# Patient Record
Sex: Female | Born: 1960 | Race: White | Hispanic: No | Marital: Married | Smoking: Never smoker
Health system: Southern US, Community
[De-identification: ages and names within clinical notes are randomized; demographics above are authoritative.]

## PROBLEM LIST (undated history)

## (undated) HISTORY — PX: CHOLECYSTECTOMY: SHX55

---

## 2009-02-02 ENCOUNTER — Encounter: Admission: RE | Admit: 2009-02-02 | Discharge: 2009-02-02 | Payer: Self-pay

## 2010-02-02 ENCOUNTER — Encounter
Admission: RE | Admit: 2010-02-02 | Discharge: 2010-02-02 | Payer: Self-pay | Source: Home / Self Care | Attending: Family Medicine | Admitting: Family Medicine

## 2010-12-27 ENCOUNTER — Other Ambulatory Visit: Payer: Self-pay | Admitting: Obstetrics and Gynecology

## 2010-12-27 DIAGNOSIS — Z1231 Encounter for screening mammogram for malignant neoplasm of breast: Secondary | ICD-10-CM

## 2011-02-08 ENCOUNTER — Other Ambulatory Visit: Payer: Self-pay | Admitting: Obstetrics and Gynecology

## 2011-02-08 ENCOUNTER — Ambulatory Visit
Admission: RE | Admit: 2011-02-08 | Discharge: 2011-02-08 | Disposition: A | Discharge status: Home / Self Care | Payer: Self-pay | Source: Ambulatory Visit | Attending: Obstetrics and Gynecology | Admitting: Obstetrics and Gynecology

## 2011-02-08 DIAGNOSIS — Z1231 Encounter for screening mammogram for malignant neoplasm of breast: Secondary | ICD-10-CM

## 2011-10-25 ENCOUNTER — Encounter: Payer: Self-pay | Admitting: *Deleted

## 2011-10-25 ENCOUNTER — Emergency Department
Admission: EM | Admit: 2011-10-25 | Discharge: 2011-10-25 | Disposition: A | Discharge status: Home / Self Care | Payer: Commercial Managed Care - PPO | Source: Home / Self Care

## 2011-10-25 DIAGNOSIS — R300 Dysuria: Secondary | ICD-10-CM

## 2011-10-25 LAB — POCT URINALYSIS DIP (MANUAL ENTRY)
Nitrite, UA: POSITIVE
pH, UA: 5 (ref 5–8)

## 2011-10-25 MED ORDER — CEFTRIAXONE SODIUM 1 G IJ SOLR
1.0000 g | Freq: Once | INTRAMUSCULAR | Status: AC
  Administered 2011-10-25: 1 g via INTRAMUSCULAR

## 2011-10-25 MED ORDER — CEPHALEXIN 500 MG PO CAPS: 500.0000 mg | ORAL_CAPSULE | Freq: Three times a day (TID) | ORAL | Status: AC

## 2011-10-25 NOTE — ED Provider Notes (Signed)
History     CSN: [id number]  Arrival date & time 10/25/11  1110   First MD Initiated Contact with Patient 10/25/11 1111      Chief Complaint  Patient presents with  . Dysuria   HPI DYSURIA Onset:  1 week  Description: dysuria, increased urinary frequency, mild flank pain  Modifying factors: has been taking azo for last 2-3 days; mildly improved sxs, now returned   Symptoms Urgency:  yes Frequency: yes  Hesitancy:  yes Hematuria:  no Flank Pain:  mild Fever: no Nausea/Vomiting:  no Missed LMP: no STD exposure: no Discharge: no Irritants: no Rash: no  Red Flags   More than 3 UTI's last 12 months:  no PMH of  Diabetes or Immunosuppression:  no Renal Disease/Calculi: no Urinary Tract Abnormality:  no Instrumentation or Trauma: no    History reviewed. No pertinent past medical history.  Past Surgical History  Procedure Date  . Cholecystectomy     Family History  Problem Relation Age of Onset  . Diabetes Mother     History  Substance Use Topics  . Smoking status: Never Smoker   . Smokeless tobacco: Not on file  . Alcohol Use: Yes    OB History    Grav Para Term Preterm Abortions TAB SAB Ect Mult Living                  Review of Systems  All other systems reviewed and are negative.    Allergies  Review of patient's allergies indicates no known allergies.  Home Medications   Current Outpatient Rx  Name Route Sig Dispense Refill  . MONTELUKAST SODIUM 10 MG PO TABS Oral Take 10 mg by mouth at bedtime.      BP 115/77  Pulse 88  Temp 98.1 F (36.7 C) (Oral)  Resp 14  Ht 5\' 1"  (1.549 m)  Wt 137 lb (62.143 kg)  BMI 25.89 kg/m2  SpO2 95%  Physical Exam  Constitutional: She is oriented to person, place, and time. She appears well-developed and well-nourished.  HENT:  Head: Normocephalic and atraumatic.  Eyes: Conjunctivae normal are normal. Pupils are equal, round, and reactive to light.  Neck: Normal range of motion. Neck supple.    Cardiovascular: Normal rate and regular rhythm.   Pulmonary/Chest: Effort normal and breath sounds normal.  Abdominal: Soft. Bowel sounds are normal.       + suprapubic tenderness    Musculoskeletal: Normal range of motion.  Neurological: She is alert and oriented to person, place, and time.  Skin: Skin is warm.    ED Course  Procedures (including critical care time)   Labs Reviewed  POCT URINALYSIS DIP (MANUAL ENTRY)  URINE CULTURE   No results found.   1. Dysuria       MDM  Will clinically treat for UTI. Rocephin 1gm IM x1 give 1 week of sxs.  No clinical signs of ascending infection currently. Afebrile.  Keflex x 7 days.  Urine culture.  Noted urine glucose of 250. CBG in 90s-reassuring. May have been 2/2 AZO. Diabetic red flags discussed.  Discussed infectious and general red flags.  Follow up as needed.       The patient and/or caregiver has been counseled thoroughly with regard to treatment plan and/or medications prescribed including dosage, schedule, interactions, rationale for use, and possible side effects and they verbalize understanding. Diagnoses and expected course of recovery discussed and will return if not improved as expected or if the condition worsens. Patient  and/or caregiver verbalized understanding.             Doree Albee, MD 10/25/11 1235

## 2011-10-25 NOTE — ED Notes (Signed)
Patient c/o urinary frequency and dysuria x 1 week. Pelvic pain x 1 1/2 days. Taken AZO for 2 days.

## 2011-10-27 ENCOUNTER — Telehealth: Payer: Self-pay | Admitting: *Deleted

## 2011-10-28 LAB — URINE CULTURE

## 2012-02-14 ENCOUNTER — Other Ambulatory Visit: Payer: Self-pay | Admitting: Obstetrics and Gynecology

## 2012-02-14 ENCOUNTER — Ambulatory Visit (INDEPENDENT_AMBULATORY_CARE_PROVIDER_SITE_OTHER): Payer: Commercial Managed Care - PPO

## 2012-02-14 DIAGNOSIS — Z1231 Encounter for screening mammogram for malignant neoplasm of breast: Secondary | ICD-10-CM

## 2013-02-14 ENCOUNTER — Ambulatory Visit (INDEPENDENT_AMBULATORY_CARE_PROVIDER_SITE_OTHER): Payer: Commercial Managed Care - PPO

## 2013-02-14 DIAGNOSIS — Z1231 Encounter for screening mammogram for malignant neoplasm of breast: Secondary | ICD-10-CM

## 2014-01-13 ENCOUNTER — Other Ambulatory Visit: Payer: Self-pay | Admitting: Obstetrics and Gynecology

## 2014-01-13 DIAGNOSIS — Z1231 Encounter for screening mammogram for malignant neoplasm of breast: Secondary | ICD-10-CM

## 2014-02-20 ENCOUNTER — Ambulatory Visit (INDEPENDENT_AMBULATORY_CARE_PROVIDER_SITE_OTHER): Payer: Commercial Managed Care - PPO

## 2014-02-20 DIAGNOSIS — Z1231 Encounter for screening mammogram for malignant neoplasm of breast: Secondary | ICD-10-CM

## 2014-12-13 ENCOUNTER — Emergency Department (INDEPENDENT_AMBULATORY_CARE_PROVIDER_SITE_OTHER): Payer: Commercial Managed Care - PPO

## 2014-12-13 ENCOUNTER — Emergency Department
Admission: EM | Admit: 2014-12-13 | Discharge: 2014-12-13 | Disposition: A | Discharge status: Home / Self Care | Payer: Commercial Managed Care - PPO | Source: Home / Self Care | Attending: Family Medicine | Admitting: Family Medicine

## 2014-12-13 ENCOUNTER — Encounter: Payer: Self-pay | Admitting: Emergency Medicine

## 2014-12-13 DIAGNOSIS — X58XXXA Exposure to other specified factors, initial encounter: Secondary | ICD-10-CM | POA: Diagnosis not present

## 2014-12-13 DIAGNOSIS — S62305A Unspecified fracture of fourth metacarpal bone, left hand, initial encounter for closed fracture: Secondary | ICD-10-CM

## 2014-12-13 DIAGNOSIS — S62303A Unspecified fracture of third metacarpal bone, left hand, initial encounter for closed fracture: Secondary | ICD-10-CM

## 2014-12-13 DIAGNOSIS — S62309A Unspecified fracture of unspecified metacarpal bone, initial encounter for closed fracture: Secondary | ICD-10-CM

## 2014-12-13 MED ORDER — HYDROCODONE-ACETAMINOPHEN 10-325 MG PO TABS: ORAL_TABLET | ORAL | Status: DC

## 2014-12-13 NOTE — Consult Note (Signed)
   Subjective:    I'm seeing this patient as a consultation for:  Junius FinnerErin O'Malley PA-C  CC: Left hand fracture  HPI: Earlier today this pleasant 54 year old female was putting up decorations, fell onto her left hand and had immediate pain, swelling, bruising, and mild deformity. She presented to urgent care where x-rays showed a couple of fractures to her metacarpals and I was called for further evaluation and definitive treatment. Her pain is mild to moderate, and she has no numbness or tingling.  Past medical history, Surgical history, Family history not pertinant except as noted below, Social history, Allergies, and medications have been entered into the medical record, reviewed, and no changes needed.   Review of Systems: No headache, visual changes, nausea, vomiting, diarrhea, constipation, dizziness, abdominal pain, skin rash, fevers, chills, night sweats, weight loss, swollen lymph nodes, body aches, joint swelling, muscle aches, chest pain, shortness of breath, mood changes, visual or auditory hallucinations.   Objective:   General: Well Developed, well nourished, and in no acute distress.  Neuro/Psych: Alert and oriented x3, extra-ocular muscles intact, able to move all 4 extremities, sensation grossly intact. Skin: Warm and dry, no rashes noted.  Respiratory: Not using accessory muscles, speaking in full sentences, trachea midline.  Cardiovascular: Pulses palpable, no extremity edema. Abdomen: Does not appear distended. Left hand: Visibly swollen, slightly bruised, tender to palpation over the third and fourth metacarpal shafts with palpable crepitus. Neurovascular intact distally.  X-rays reviewed and shows spiral fractures that are mildly displaced but not angulated through the third and fourth metacarpal shafts.  A large ulnar gutter splint was placed.  Impression and Recommendations:   This case required medical decision making of moderate complexity.   1. Third and fourth  metacarpal shaft fractures: Ulnar gutter splint, hydrocodone, I will discuss her x-rays with hand surgery but she should plan to see me back middle of next week for additional x-rays.  ___________________________________________ Ihor Austinhomas J. Benjamin Stainhekkekandam, M.D., ABFM., CAQSM. Primary Care and Sports Medicine [facility]  Adjunct Instructor of Family Medicine  [employer]

## 2014-12-13 NOTE — ED Provider Notes (Signed)
CSN: [id number]     Arrival date & time 12/13/14  1159 History   First MD Initiated Contact with Patient 12/13/14 1206     Chief Complaint  Patient presents with  . Hand Pain   (Consider location/radiation/quality/duration/timing/severity/associated sxs/prior Treatment) HPI  Pt is a 54yo female presenting to [facility] with c/o sudden onset Left hand pain after she lost her footing and fell off a step-stool about 2 hours PTA. Pt states she landed on her Left sided with majority of her weight landing on her Left hand.  Pain is worse on the dorsal lateral aspect of her hand. Pain is aching and throbbing, worse with movement and palpation, 4/10 at this time. No pain medication taken PTA. Pt has been using her husband's old sling and an ice pain with minimal relief.  Pt states she was unable to move her hand immediately after the incident but has gradually had increased ROM but states she still cannot grasp anything. Pt notes she also landed on her Left hip but states it feels like a bruise. Denies hitting her head. No other injuries. Pt is Right hand dominant.   History reviewed. No pertinent past medical history. Past Surgical History  Procedure Laterality Date  . Cholecystectomy     Family History  Problem Relation Age of Onset  . Diabetes Mother    Social History  Substance Use Topics  . Smoking status: Never Smoker   . Smokeless tobacco: None  . Alcohol Use: Yes   OB History    No data available     Review of Systems  Musculoskeletal: Positive for myalgias, joint swelling and arthralgias.       Left hand and wrist  Skin: Negative for color change and wound.  Neurological: Positive for weakness ( Left hand). Negative for numbness.    Allergies  Review of patient's allergies indicates no known allergies.  Home Medications   Prior to Admission medications   Medication Sig Start Date End Date Taking? Authorizing Provider  HYDROcodone-acetaminophen (NORCO) 10-325 MG tablet 1/2-1  tablet by mouth every 8 hours as needed for pain 12/13/14   Monica Becton, MD  montelukast (SINGULAIR) 10 MG tablet Take 10 mg by mouth at bedtime.    Historical Provider, MD   Meds Ordered and Administered this Visit  Medications - No data to display  BP 133/88 mmHg  Pulse 72  Temp(Src) 98.4 F (36.9 C) (Oral)  Ht  (1.549 m)  Wt 136 lb 12 oz (62.029 kg)  BMI 25.85 kg/m2  SpO2 98% No data found.   Physical Exam  Constitutional: She is oriented to person, place, and time. She appears well-developed and well-nourished.  HENT:  Head: Normocephalic and atraumatic.  Eyes: EOM are normal.  Neck: Normal range of motion.  Cardiovascular: Normal rate.   Pulses:      Radial pulses are 2+ on the left side.  Left hand: cap refill <3 seconds  Pulmonary/Chest: Effort normal.  Musculoskeletal: She exhibits edema and tenderness.  Left hand: mild edema to dorsal aspect. Tenderness over 3rd-5th metacarpals. Full ROM at the wrist but increased pain. 3/5 grip strength. Unable to oppose thumb to 5th finger due to pain. No tenderness to fingers  Left elbow and shoulder: full ROM, non-tender  Neurological: She is alert and oriented to person, place, and time.  Left hand: normal sensation compared to Right hand  Skin: Skin is warm and dry.  Left hand and wrist: skin in tact. No ecchymosis or erythema  Psychiatric: She has a normal mood and affect. Her behavior is normal.  Nursing note and vitals reviewed.   ED Course  Procedures (including critical care time)  Labs Review Labs Reviewed - No data to display  Imaging Review Dg Wrist Complete Left  12/13/2014  CLINICAL DATA:  Patient fell off a step ladder today injuring her left hand and wrist. EXAM: LEFT WRIST - COMPLETE 3+ VIEW COMPARISON:  None FINDINGS: There are oblique fractures of the third and fourth metacarpals, minimally displaced, non comminuted non angulated. No wrist fracture. Wrist joints are normally spaced and  aligned. The soft tissues are unremarkable. IMPRESSION: 1. No wrist fracture or dislocation. 2. Fractures of the third and fourth metacarpals. Electronically Signed   By: Amie Portlandavid  Ormond M.D.   On: 12/13/2014 12:58   Dg Hand Complete Left  12/13/2014  CLINICAL DATA:  Larey SeatFell off ladder.  Injury to left hand and left wrist EXAM: LEFT HAND - COMPLETE 3+ VIEW COMPARISON:  12/13/2014 FINDINGS: There are acute, obliquely oriented fractures involving the base of the third and fourth metacarpal bones. Mild volar angulation of the distal fracture fragments. No dislocations identified. No radio-opaque foreign bodies or soft tissue calcifications. IMPRESSION: 1. Acute fractures are noted involving the third and fourth metacarpal bones. Electronically Signed   By: Signa Kellaylor  Stroud M.D.   On: 12/13/2014 12:57      MDM   1. Fracture of fourth metacarpal bone of left hand, closed, initial encounter   2. Fracture of third metacarpal bone of left hand, closed, initial encounter     Pt c/o Left hand hand pain after falling off a stepstool 2 hours PTA.  Mild left hip pain but no other injuries. Pt has decreased strength and ROM in Left hand. Tenderness worst over 3-5th metacarpals. Sensation and circulation in tact. Skin in tact.  Plain films: fracture of third and fourth metacarpal bones. Mild volar angulation.  Consulted with Dr. Benjamin Stainhekkekandam, Sports Medicine, who also reviewed imaging and examined pt.  Pt placed in an ulnar-gutter splint.  See his consult note.    F/u with Dr. Benjamin Stainhekkekandam next week for recheck of symptoms. Pt was prescribed vicodin for severe pain. May take acetaminophen and ibuprofen otherwise. Encouraged ice and elevation. Patient verbalized understanding and agreement with treatment plan.     Junius Finnerrin O'Malley, PA-C 12/13/14 1428

## 2014-12-13 NOTE — ED Notes (Signed)
Patient states that she fell at home this morning striking her left hand on the floor. Slight edema noted and increased pain with movement. Rates pain 4/10

## 2014-12-16 ENCOUNTER — Ambulatory Visit (INDEPENDENT_AMBULATORY_CARE_PROVIDER_SITE_OTHER): Payer: Commercial Managed Care - PPO | Admitting: Sports Medicine

## 2014-12-16 ENCOUNTER — Encounter: Payer: Self-pay | Admitting: Sports Medicine

## 2014-12-16 ENCOUNTER — Ambulatory Visit (INDEPENDENT_AMBULATORY_CARE_PROVIDER_SITE_OTHER): Payer: Commercial Managed Care - PPO

## 2014-12-16 VITALS — BP 120/76 | HR 77 | Wt 135.0 lb

## 2014-12-16 DIAGNOSIS — S62309D Unspecified fracture of unspecified metacarpal bone, subsequent encounter for fracture with routine healing: Secondary | ICD-10-CM

## 2014-12-16 DIAGNOSIS — X58XXXD Exposure to other specified factors, subsequent encounter: Secondary | ICD-10-CM | POA: Diagnosis not present

## 2014-12-16 DIAGNOSIS — S62309A Unspecified fracture of unspecified metacarpal bone, initial encounter for closed fracture: Secondary | ICD-10-CM | POA: Insufficient documentation

## 2014-12-16 DIAGNOSIS — S62303D Unspecified fracture of third metacarpal bone, left hand, subsequent encounter for fracture with routine healing: Secondary | ICD-10-CM

## 2014-12-16 DIAGNOSIS — S62305D Unspecified fracture of fourth metacarpal bone, left hand, subsequent encounter for fracture with routine healing: Secondary | ICD-10-CM | POA: Diagnosis not present

## 2014-12-16 NOTE — Assessment & Plan Note (Signed)
Stable on x-ray, continue ulnar gutter splint. Pain control is adequate. Return to see me in 2 weeks, x-ray before visit.  I billed a fracture code for this encounter, all subsequent visits will be post-op checks in the global period.

## 2014-12-16 NOTE — Progress Notes (Signed)
  Subjective:    CC: recheck fracture  HPI: This is a pleasant 54 year old female, I saw her this weekend for a hand fracture involving the third and fourth metacarpal shaft, we placed her in an ulnar gutter splint, I did discuss this with orthopedic hand surgery, and they recommended continued conservative care. She is doing well, she is having some swelling, and feels as though her splint is a bit tight. Otherwise she's only had to use a single hydrocodone, it has only been using Tylenol.  Past medical history, Surgical history, Family history not pertinant except as noted below, Social history, Allergies, and medications have been entered into the medical record, reviewed, and no changes needed.   Review of Systems: No fevers, chills, night sweats, weight loss, chest pain, or shortness of breath.   Objective:    General: Well Developed, well nourished, and in no acute distress.  Neuro: Alert and oriented x3, extra-ocular muscles intact, sensation grossly intact.  HEENT: Normocephalic, atraumatic, pupils equal round reactive to light, neck supple, no masses, no lymphadenopathy, thyroid nonpalpable.  Skin: Warm and dry, no rashes. Cardiac: Regular rate and rhythm, no murmurs rubs or gallops, no lower extremity edema.  Respiratory: Clear to auscultation bilaterally. Not using accessory muscles, speaking in full sentences. Left hand: Splint is removed, minimally tender over the fracture site, bruising is present with a few fracture blisters. She is neurovascularly intact distally.  I removed the splint, and then reapplied it any looser fashion.  X-rays reviewed, fracture alignment is stable, there continues to be mild displacement without angulation or evidence of rotatory deformity.   Impression and Recommendations:

## 2014-12-17 ENCOUNTER — Ambulatory Visit: Payer: Commercial Managed Care - PPO | Admitting: Sports Medicine

## 2014-12-22 ENCOUNTER — Telehealth: Payer: Self-pay | Admitting: Sports Medicine

## 2014-12-22 NOTE — Telephone Encounter (Signed)
Pt advised,verbalized understanding. 

## 2014-12-22 NOTE — Telephone Encounter (Signed)
Pt contacted clinic to see if she was able to drive for long distances with her fracture. Pt states this is part of her job, and has a long trip coming up soon. Will route to Provider for review.

## 2014-12-22 NOTE — Telephone Encounter (Signed)
Not a problem, just take a baby aspirin for trips longer than 2 hours.

## 2014-12-29 ENCOUNTER — Telehealth: Payer: Self-pay | Admitting: Emergency Medicine

## 2014-12-29 ENCOUNTER — Emergency Department (INDEPENDENT_AMBULATORY_CARE_PROVIDER_SITE_OTHER)
Admission: EM | Admit: 2014-12-29 | Discharge: 2014-12-29 | Disposition: A | Discharge status: Home / Self Care | Payer: Commercial Managed Care - PPO | Source: Home / Self Care | Attending: Family Medicine | Admitting: Family Medicine

## 2014-12-29 ENCOUNTER — Encounter: Payer: Self-pay | Admitting: Sports Medicine

## 2014-12-29 ENCOUNTER — Ambulatory Visit (INDEPENDENT_AMBULATORY_CARE_PROVIDER_SITE_OTHER): Payer: Commercial Managed Care - PPO | Admitting: Sports Medicine

## 2014-12-29 ENCOUNTER — Ambulatory Visit (INDEPENDENT_AMBULATORY_CARE_PROVIDER_SITE_OTHER): Payer: Commercial Managed Care - PPO

## 2014-12-29 DIAGNOSIS — S62309D Unspecified fracture of unspecified metacarpal bone, subsequent encounter for fracture with routine healing: Secondary | ICD-10-CM

## 2014-12-29 DIAGNOSIS — S62305D Unspecified fracture of fourth metacarpal bone, left hand, subsequent encounter for fracture with routine healing: Secondary | ICD-10-CM | POA: Diagnosis not present

## 2014-12-29 DIAGNOSIS — S62303D Unspecified fracture of third metacarpal bone, left hand, subsequent encounter for fracture with routine healing: Secondary | ICD-10-CM | POA: Diagnosis not present

## 2014-12-29 DIAGNOSIS — X58XXXD Exposure to other specified factors, subsequent encounter: Secondary | ICD-10-CM

## 2014-12-29 DIAGNOSIS — Z09 Encounter for follow-up examination after completed treatment for conditions other than malignant neoplasm: Secondary | ICD-10-CM

## 2014-12-29 NOTE — Progress Notes (Signed)
  Subjective:  1 week post fracture of the third and fourth metacarpal shafts, doing well. Ulnar gutter splint.  Objective: General: Well-developed, well-nourished, and in no acute distress. Left hand: Splint is removed: Tender to palpation over the fracture, no sign of bacterial superinfection, fracture blisters have popped. Neurovascularly intact distally. There may be a small degree of rotational deformity of the middle finger.  X-rays reviewed and shows stability of the fracture, minimally displaced, minimally angulated.  Assessment/plan:

## 2014-12-29 NOTE — ED Notes (Signed)
Laurie Weaver reports that her splint that was reapplied today at her visit with Dr T feels tighter than it previously did and is causing pain.

## 2014-12-29 NOTE — Assessment & Plan Note (Signed)
Recheck fracture, x-ray before visit

## 2014-12-29 NOTE — ED Provider Notes (Signed)
Pt is a 54yo female [facility] with c/o Left hand swelling and discomfort after she had a splint readjusted this afternoon with Dr. Benjamin Stainhekkekandam.  Pt was being seen by Dr. Benjamin Stainhekkekandam for a follow up visit 2 weeks after Left hand fracture on 11/19.  [facility]    Ace wrap was removed, Vaseline applied to a dry area of skin on Left wrist. Splint readjusted slightly, then ace wrap reapplied. Pt stated the splint feels much better after loosing of ace wrap.   Pt encouraged to f/u as instructed by Dr. Benjamin Stainhekkekandam. Patient verbalized understanding and agreement with treatment plan.      Junius FinnerErin O'Malley, PA-C 12/29/14 1757

## 2015-01-12 ENCOUNTER — Ambulatory Visit (INDEPENDENT_AMBULATORY_CARE_PROVIDER_SITE_OTHER): Payer: Commercial Managed Care - PPO | Admitting: Sports Medicine

## 2015-01-12 ENCOUNTER — Other Ambulatory Visit: Payer: Self-pay | Admitting: Obstetrics and Gynecology

## 2015-01-12 ENCOUNTER — Ambulatory Visit (INDEPENDENT_AMBULATORY_CARE_PROVIDER_SITE_OTHER): Payer: Commercial Managed Care - PPO

## 2015-01-12 ENCOUNTER — Encounter: Payer: Self-pay | Admitting: Sports Medicine

## 2015-01-12 VITALS — BP 128/75 | HR 73 | Temp 98.0°F | Resp 16 | Wt 133.7 lb

## 2015-01-12 DIAGNOSIS — S62309D Unspecified fracture of unspecified metacarpal bone, subsequent encounter for fracture with routine healing: Secondary | ICD-10-CM

## 2015-01-12 DIAGNOSIS — Z1231 Encounter for screening mammogram for malignant neoplasm of breast: Secondary | ICD-10-CM

## 2015-01-12 DIAGNOSIS — X58XXXD Exposure to other specified factors, subsequent encounter: Secondary | ICD-10-CM | POA: Diagnosis not present

## 2015-01-12 DIAGNOSIS — S62303D Unspecified fracture of third metacarpal bone, left hand, subsequent encounter for fracture with routine healing: Secondary | ICD-10-CM

## 2015-01-12 DIAGNOSIS — S62305D Unspecified fracture of fourth metacarpal bone, left hand, subsequent encounter for fracture with routine healing: Secondary | ICD-10-CM | POA: Diagnosis not present

## 2015-01-12 NOTE — Progress Notes (Signed)
  Subjective:  3 weeks post closed fractures, spiral of the third and fourth metacarpals, doing well.   Objective: General: Well-developed, well-nourished, and in no acute distress. Left hand: There is still a bit of pain over the fractures, motion is still limited by pain. There is mild bruising and swelling, fracture blisters have healed.  X-rays reviewed, there is good alignment without displacement or angulation, good bony callus formation.  Assessment/plan:

## 2015-01-12 NOTE — Assessment & Plan Note (Addendum)
Continue splint, wrapped again, return in 3 weeks. No x-rays needed. At the next visit we will probably get her into physical therapy to regain range of motion.

## 2015-02-02 ENCOUNTER — Encounter: Payer: Self-pay | Admitting: Sports Medicine

## 2015-02-02 ENCOUNTER — Ambulatory Visit (INDEPENDENT_AMBULATORY_CARE_PROVIDER_SITE_OTHER): Payer: Commercial Managed Care - PPO | Admitting: Sports Medicine

## 2015-02-02 ENCOUNTER — Ambulatory Visit: Payer: Commercial Managed Care - PPO | Admitting: Sports Medicine

## 2015-02-02 ENCOUNTER — Ambulatory Visit (INDEPENDENT_AMBULATORY_CARE_PROVIDER_SITE_OTHER): Payer: Commercial Managed Care - PPO

## 2015-02-02 DIAGNOSIS — S62324D Displaced fracture of shaft of fourth metacarpal bone, right hand, subsequent encounter for fracture with routine healing: Secondary | ICD-10-CM | POA: Diagnosis not present

## 2015-02-02 DIAGNOSIS — S62323D Displaced fracture of shaft of third metacarpal bone, left hand, subsequent encounter for fracture with routine healing: Secondary | ICD-10-CM | POA: Diagnosis not present

## 2015-02-02 DIAGNOSIS — X58XXXD Exposure to other specified factors, subsequent encounter: Secondary | ICD-10-CM

## 2015-02-02 DIAGNOSIS — S62309D Unspecified fracture of unspecified metacarpal bone, subsequent encounter for fracture with routine healing: Secondary | ICD-10-CM

## 2015-02-02 NOTE — Assessment & Plan Note (Signed)
7 weeks post fractures, repeat x-ray. There is some stiffness as expected, discontinue splint, and start formal physical therapy.

## 2015-02-02 NOTE — Progress Notes (Signed)
  Subjective: Several weeks post nondisplaced fractures of the bases of the third and fourth metacarpals, doing well   Objective: General: Well-developed, well-nourished, and in no acute distress. Left hand: Splint is removed, no tenderness over the fractures, still with significant limitations in range of motion and stiffness in the interphalangeal joints due to immobility.  Assessment/plan:

## 2015-02-02 NOTE — Addendum Note (Signed)
Addended by: Monica BectonHEKKEKANDAM, THOMAS J on: 02/02/2015 02:11 PM   Modules accepted: Orders

## 2015-02-27 ENCOUNTER — Ambulatory Visit (INDEPENDENT_AMBULATORY_CARE_PROVIDER_SITE_OTHER): Payer: Commercial Managed Care - PPO

## 2015-02-27 DIAGNOSIS — Z1231 Encounter for screening mammogram for malignant neoplasm of breast: Secondary | ICD-10-CM

## 2015-03-02 ENCOUNTER — Ambulatory Visit (INDEPENDENT_AMBULATORY_CARE_PROVIDER_SITE_OTHER): Payer: Commercial Managed Care - PPO | Admitting: Sports Medicine

## 2015-03-02 ENCOUNTER — Encounter: Payer: Self-pay | Admitting: Sports Medicine

## 2015-03-02 VITALS — BP 128/77 | HR 85 | Resp 16 | Wt 136.3 lb

## 2015-03-02 DIAGNOSIS — S62309D Unspecified fracture of unspecified metacarpal bone, subsequent encounter for fracture with routine healing: Secondary | ICD-10-CM

## 2015-03-02 NOTE — Assessment & Plan Note (Signed)
Doing well post fractures, continue with home rehabilitation. She is approximately 75% better, she can return to see me as needed if hand range of motion doesn't return to normal in a four-month period

## 2015-03-02 NOTE — Progress Notes (Signed)
  Subjective: 11 weeks post fractures of the third and fourth metacarpal shafts, doing well with physical therapy, approximately 75% back to normal. Minimal pain.  Objective: General: Well-developed, well-nourished, and in no acute distress. Left hand: Still has some fullness over the metacarpals likely in association with the bony callus. Is almost able to attain full flexion at the MCPs.  Assessment/plan:

## 2015-09-27 ENCOUNTER — Encounter: Payer: Self-pay | Admitting: Emergency Medicine

## 2015-09-27 ENCOUNTER — Emergency Department
Admission: EM | Admit: 2015-09-27 | Discharge: 2015-09-27 | Disposition: A | Discharge status: Home / Self Care | Payer: Commercial Managed Care - PPO | Source: Home / Self Care | Attending: Family Medicine | Admitting: Family Medicine

## 2015-09-27 DIAGNOSIS — S61210A Laceration without foreign body of right index finger without damage to nail, initial encounter: Secondary | ICD-10-CM

## 2015-09-27 DIAGNOSIS — Z23 Encounter for immunization: Secondary | ICD-10-CM | POA: Diagnosis not present

## 2015-09-27 MED ORDER — TETANUS-DIPHTH-ACELL PERTUSSIS 5-2.5-18.5 LF-MCG/0.5 IM SUSP
0.5000 mL | Freq: Once | INTRAMUSCULAR | Status: AC
  Administered 2015-09-27: 0.5 mL via INTRAMUSCULAR

## 2015-09-27 MED ORDER — IBUPROFEN 600 MG PO TABS: 600.0000 mg | ORAL_TABLET | Freq: Once | ORAL | Status: DC

## 2015-09-27 NOTE — Discharge Instructions (Signed)
Change dressing daily and apply Bacitracin ointment to wound.  Keep wound clean and dry.  Return for any signs of infection (or follow-up with family doctor):  Increasing redness, swelling, pain, heat, drainage, etc. Return in 10 days for suture removal (or follow-up with family doctor).  May take Tylenol or Ibuprofen as needed for pain.

## 2015-09-27 NOTE — ED Provider Notes (Signed)
[facility]    CSN: [id number] Arrival date & time: 09/27/15  1728  First Provider Contact:  First MD Initiated Contact with Patient 09/27/15 1829        History   Chief Complaint Chief Complaint  Patient presents with  . Finger Injury    HPI Laurie Weaver is a 55 y.o. female.   Patient lacerated her right index finger one hour ago on a food processor blade.  She is unsure of her last Tdap.   The history is provided by the patient and the spouse.  Laceration  Location:  Finger Finger laceration location:  R index finger Length:  1cm Depth:  Through dermis Quality: straight   Bleeding: controlled   Time since incident:  1 hour Laceration mechanism:  Metal edge Pain details:    Quality:  Aching   Severity:  Mild   Timing:  Constant   Progression:  Unchanged Foreign body present:  No foreign bodies Relieved by:  Pressure Worsened by:  Movement Ineffective treatments:  None tried Tetanus status:  Out of date Associated symptoms: no swelling     History reviewed. No pertinent past medical history.  Patient Active Problem List   Diagnosis Date Noted  . Closed fracture of third and fourth metacarpals of left hand 12/16/2014    Past Surgical History:  Procedure Laterality Date  . CHOLECYSTECTOMY      OB History    No data available       Home Medications    Prior to Admission medications   Medication Sig Start Date End Date Taking? Authorizing Provider  acetaminophen (TYLENOL) 325 MG tablet Take 650 mg by mouth every 6 (six) hours as needed.    Historical Provider, MD  montelukast (SINGULAIR) 10 MG tablet Take 10 mg by mouth at bedtime.    Historical Provider, MD    Family History Family History  Problem Relation Age of Onset  . Diabetes Mother     Social History Social History  Substance Use Topics  . Smoking status: Never Smoker  . Smokeless tobacco: Never Used  . Alcohol use Yes     Allergies   Review of patient's allergies  indicates no known allergies.   Review of Systems Review of Systems  All other systems reviewed and are negative.    Physical Exam Triage Vital Signs ED Triage Vitals  Enc Vitals Group     BP 09/27/15 1751 121/71     Pulse Rate 09/27/15 1751 71     Resp 09/27/15 1751 16     Temp 09/27/15 1751 97.9 F (36.6 C)     Temp Source 09/27/15 1751 Oral     SpO2 09/27/15 1751 97 %     Weight 09/27/15 1752 135 lb (61.2 kg)     Height 09/27/15 1752 5\' 1"  (1.549 m)     Head Circumference --      Peak Flow --      Pain Score 09/27/15 1753 2     Pain Loc --      Pain Edu? --      Excl. in GC? --    No data found.   Updated Vital Signs BP 121/71 (BP Location: Left Arm)   Pulse 71   Temp 97.9 F (36.6 C) (Oral)   Resp 16   Ht 5\' 1"  (1.549 m)   Wt 135 lb (61.2 kg)   SpO2 97%   BMI 25.51 kg/m   Visual Acuity Right Eye Distance:  Left Eye Distance:   Bilateral Distance:    Right Eye Near:   Left Eye Near:    Bilateral Near:     Physical Exam  Constitutional: She appears well-developed and well-nourished. No distress.  HENT:  Head: Atraumatic.  Eyes: Pupils are equal, round, and reactive to light.  Cardiovascular: Normal rate.   Pulmonary/Chest: Effort normal.  Musculoskeletal:       Right hand: She exhibits tenderness and laceration. She exhibits normal range of motion, no bony tenderness, normal two-point discrimination, normal capillary refill, no deformity and no swelling.       Hands: Volar surface of DIP joint of the right second finger has a 1cm long simple superficial laceration as noted on diagram.  Distal phalanx has full range of motion.  Distal neurovascular function is intact.   Neurological: She is alert.  Skin: Skin is dry.  Nursing note and vitals reviewed.    UC Treatments / Results  Labs (all labs ordered are listed, but only abnormal results are displayed) Labs Reviewed - No data to display  EKG  EKG Interpretation None        Radiology No results found.  Procedures Procedures Laceration Repair Discussed benefits and risks of procedure and verbal consent obtained. Using sterile technique and digital anesthesia with 2% lidocaine without epinephrine, cleansed wound with Betadine followed by copious lavage with normal saline.  Wound carefully inspected for debris and foreign bodies; none found.  Wound closed with #3, 5-0 interrupted nylon sutures.  Bacitracin and non-stick sterile dressing applied.  Wound precautions explained to patient.  Return for suture removal in 10 days.   Medications Ordered in UC Medications  ibuprofen (ADVIL,MOTRIN) tablet 600 mg (not administered)  Tdap (BOOSTRIX) injection 0.5 mL (0.5 mLs Intramuscular Given 09/27/15 1802)     Initial Impression / Assessment and Plan / UC Course  I have reviewed the triage vital signs and the nursing notes.  Pertinent labs & imaging results that were available during my care of the patient were reviewed by me and considered in my medical decision making (see chart for details).  Clinical Course  Change dressing daily and apply Bacitracin ointment to wound.  Keep wound clean and dry.  Return for any signs of infection (or follow-up with family doctor):  Increasing redness, swelling, pain, heat, drainage, etc. Return in 10 days for suture removal (or follow-up with family doctor).  May take Tylenol or Ibuprofen as needed for pain.     Final Clinical Impressions(s) / UC Diagnoses   Final diagnoses:  Laceration of second finger of right hand, initial encounter    New Prescriptions New Prescriptions   No medications on file     Lattie HawStephen A Rahkim Rabalais, MD 10/06/15 1958

## 2015-09-27 NOTE — ED Triage Notes (Signed)
Patient just lacerated left index finger on food processor blade. Unaware of last tetanus immunization.

## 2015-10-09 ENCOUNTER — Emergency Department
Admission: EM | Admit: 2015-10-09 | Discharge: 2015-10-09 | Disposition: A | Discharge status: Home / Self Care | Payer: Commercial Managed Care - PPO | Source: Home / Self Care | Attending: Emergency Medicine | Admitting: Emergency Medicine

## 2015-10-09 DIAGNOSIS — Z5189 Encounter for other specified aftercare: Secondary | ICD-10-CM

## 2015-10-09 DIAGNOSIS — Z4802 Encounter for removal of sutures: Secondary | ICD-10-CM

## 2015-10-09 NOTE — ED Triage Notes (Signed)
Pt is here for suture removal from right index finger. No s/s of infection, tenderness.

## 2015-10-09 NOTE — ED Provider Notes (Signed)
[facility]    CSN: [id number] Arrival date & time: 10/09/15  1627  First Provider Contact:  None       History   Chief Complaint Chief Complaint  Patient presents with  . Suture / Staple Removal    HPI Laurie Weaver is a 55 y.o. female.   HPI Here for wound recheck and suture removal, 12 days status post repair of laceration right index finger. She denies any signs or symptoms of infection. Only minimally sore, but no pain . No drainage or bleeding. No numbness. She has been doing daily wound care. Function of the finger normal No past medical history on file.  Patient Active Problem List   Diagnosis Date Noted  . Closed fracture of third and fourth metacarpals of left hand 12/16/2014    Past Surgical History:  Procedure Laterality Date  . CHOLECYSTECTOMY      OB History    No data available       Home Medications    Prior to Admission medications   Medication Sig Start Date End Date Taking? Authorizing Provider  acetaminophen (TYLENOL) 325 MG tablet Take 650 mg by mouth every 6 (six) hours as needed.    Historical Provider, MD  montelukast (SINGULAIR) 10 MG tablet Take 10 mg by mouth at bedtime.    Historical Provider, MD    Family History Family History  Problem Relation Age of Onset  . Diabetes Mother     Social History Social History  Substance Use Topics  . Smoking status: Never Smoker  . Smokeless tobacco: Never Used  . Alcohol use Yes     Allergies   Review of patient's allergies indicates no known allergies.   Review of Systems Review of Systems  All other systems reviewed and are negative.    Physical Exam Triage Vital Signs ED Triage Vitals  Enc Vitals Group     BP 10/09/15 1647 118/64     Pulse Rate 10/09/15 1647 80     Resp --      Temp 10/09/15 1647 98.3 F (36.8 C)     Temp Source 10/09/15 1647 Oral     SpO2 10/09/15 1647 98 %     Weight --      Height --      Head Circumference --      Peak Flow --    Pain Score 10/09/15 1648 0     Pain Loc --      Pain Edu? --      Excl. in GC? --    No data found.   Updated Vital Signs BP 118/64 (BP Location: Left Arm)   Pulse 80   Temp 98.3 F (36.8 C) (Oral)   SpO2 98%   Visual Acuity Right Eye Distance:   Left Eye Distance:   Bilateral Distance:    Right Eye Near:   Left Eye Near:    Bilateral Near:     Physical Exam  Constitutional: She appears well-developed and well-nourished. No distress.  Musculoskeletal:  Right index finger wound is well approximated and well-healed. Clean and dry. No redness or tenderness or swelling. Tendons and function intact. Neurovascular distally intact.  Nursing note and vitals reviewed.    UC Treatments / Results  Labs (all labs ordered are listed, but only abnormal results are displayed) Labs Reviewed - No data to display  EKG  EKG Interpretation None       Radiology No results found.  Procedures Procedures (including critical care  time)  Medications Ordered in UC Medications - No data to display   Initial Impression / Assessment and Plan / UC Course  I have reviewed the triage vital signs and the nursing notes.  Pertinent labs & imaging results that were available during my care of the patient were reviewed by me and considered in my medical decision making (see chart for details).  Clinical Course    Today, I removed the 3 sutures without problem. Rechecked function and tendons and neurovascular exam was intact. Bacitracin and Band-Aid applied. Continue wound care and history guidance discussed. Recheck prn.  Final Clinical Impressions(s) / UC Diagnoses   Final diagnoses:  Visit for wound check  Encounter for removal of sutures    New Prescriptions Discharge Medication List as of 10/09/2015  5:07 PM       Lajean Manesavid Massey, MD 10/09/15 1710

## 2016-01-20 ENCOUNTER — Other Ambulatory Visit: Payer: Self-pay | Admitting: Obstetrics and Gynecology

## 2016-01-20 DIAGNOSIS — Z1239 Encounter for other screening for malignant neoplasm of breast: Secondary | ICD-10-CM

## 2016-03-04 ENCOUNTER — Ambulatory Visit (INDEPENDENT_AMBULATORY_CARE_PROVIDER_SITE_OTHER): Payer: Commercial Managed Care - PPO

## 2016-03-04 DIAGNOSIS — Z1231 Encounter for screening mammogram for malignant neoplasm of breast: Secondary | ICD-10-CM | POA: Diagnosis not present

## 2016-03-04 DIAGNOSIS — Z1239 Encounter for other screening for malignant neoplasm of breast: Secondary | ICD-10-CM

## 2016-08-17 IMAGING — CR DG HAND COMPLETE 3+V*L*
3 series · 3 of 3 positions shown · non-contrast
Comparison: 12/29/2014

CLINICAL DATA: Fall 3 weeks ago, recheck fracture, subsequent
encounter

EXAM:
LEFT HAND - COMPLETE 3+ VIEW

[hand pa]
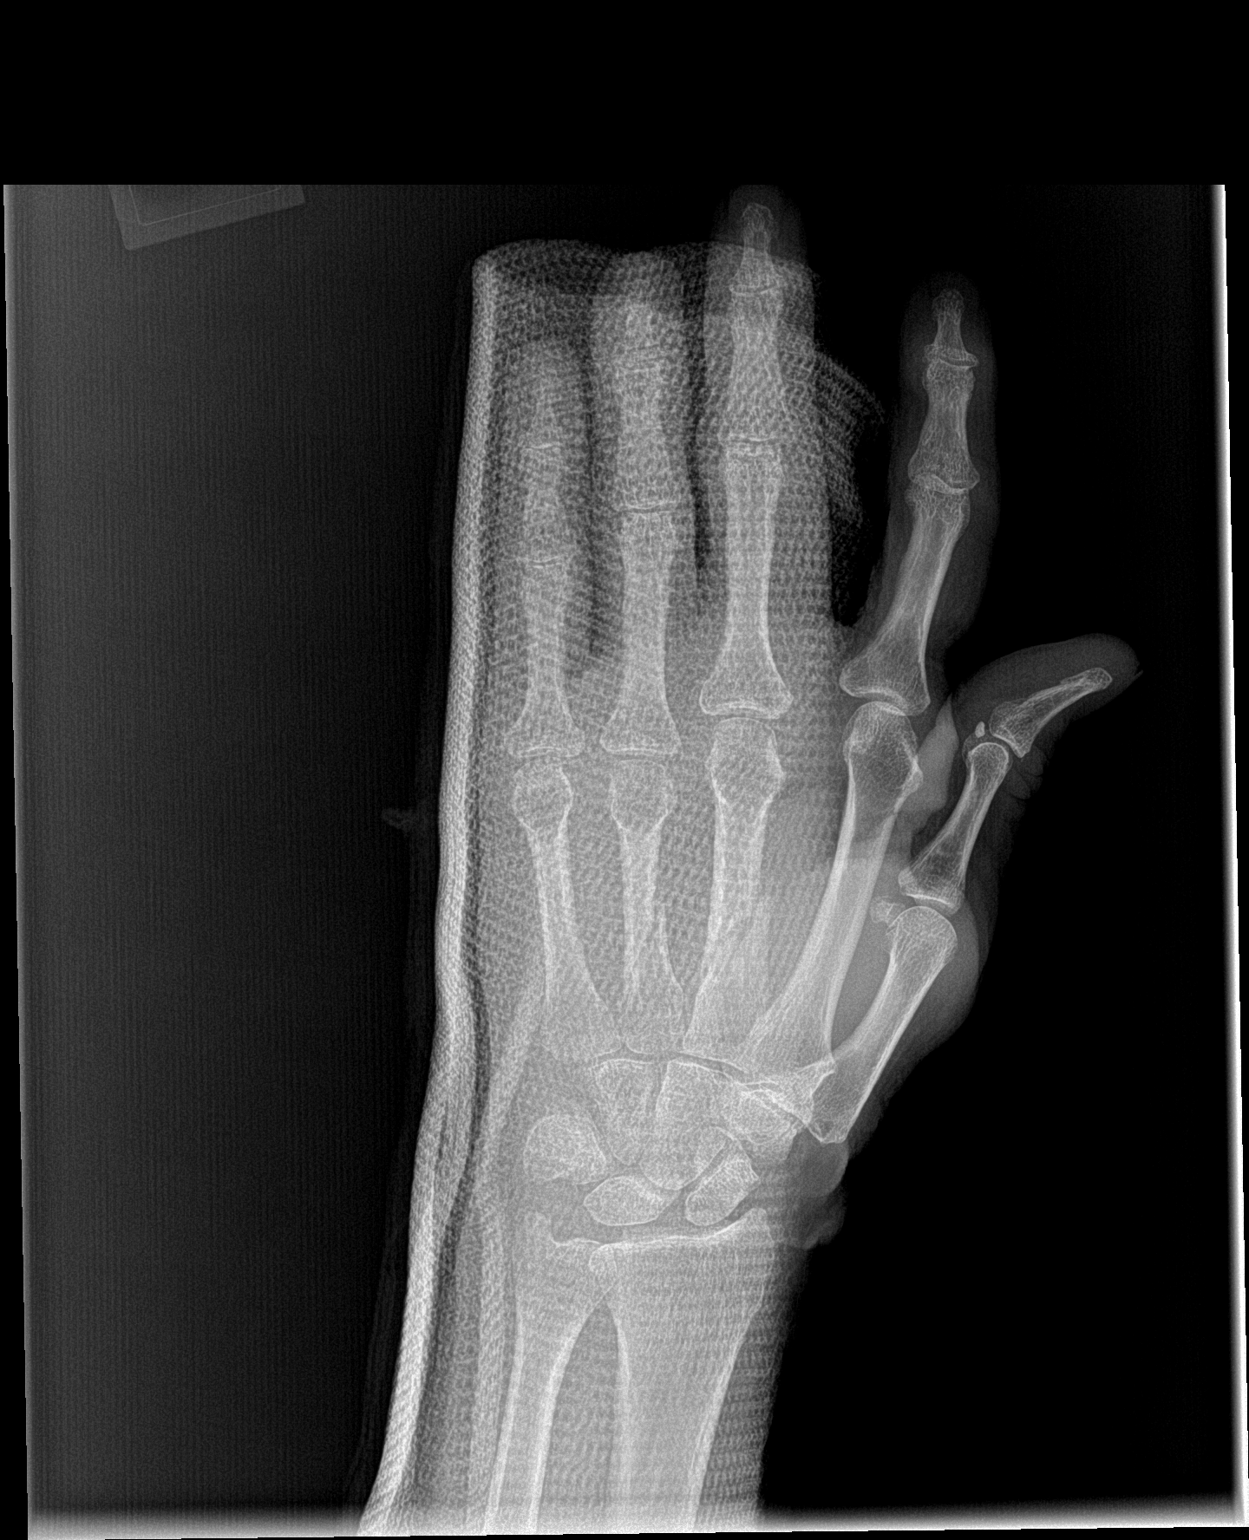

[hand obl]
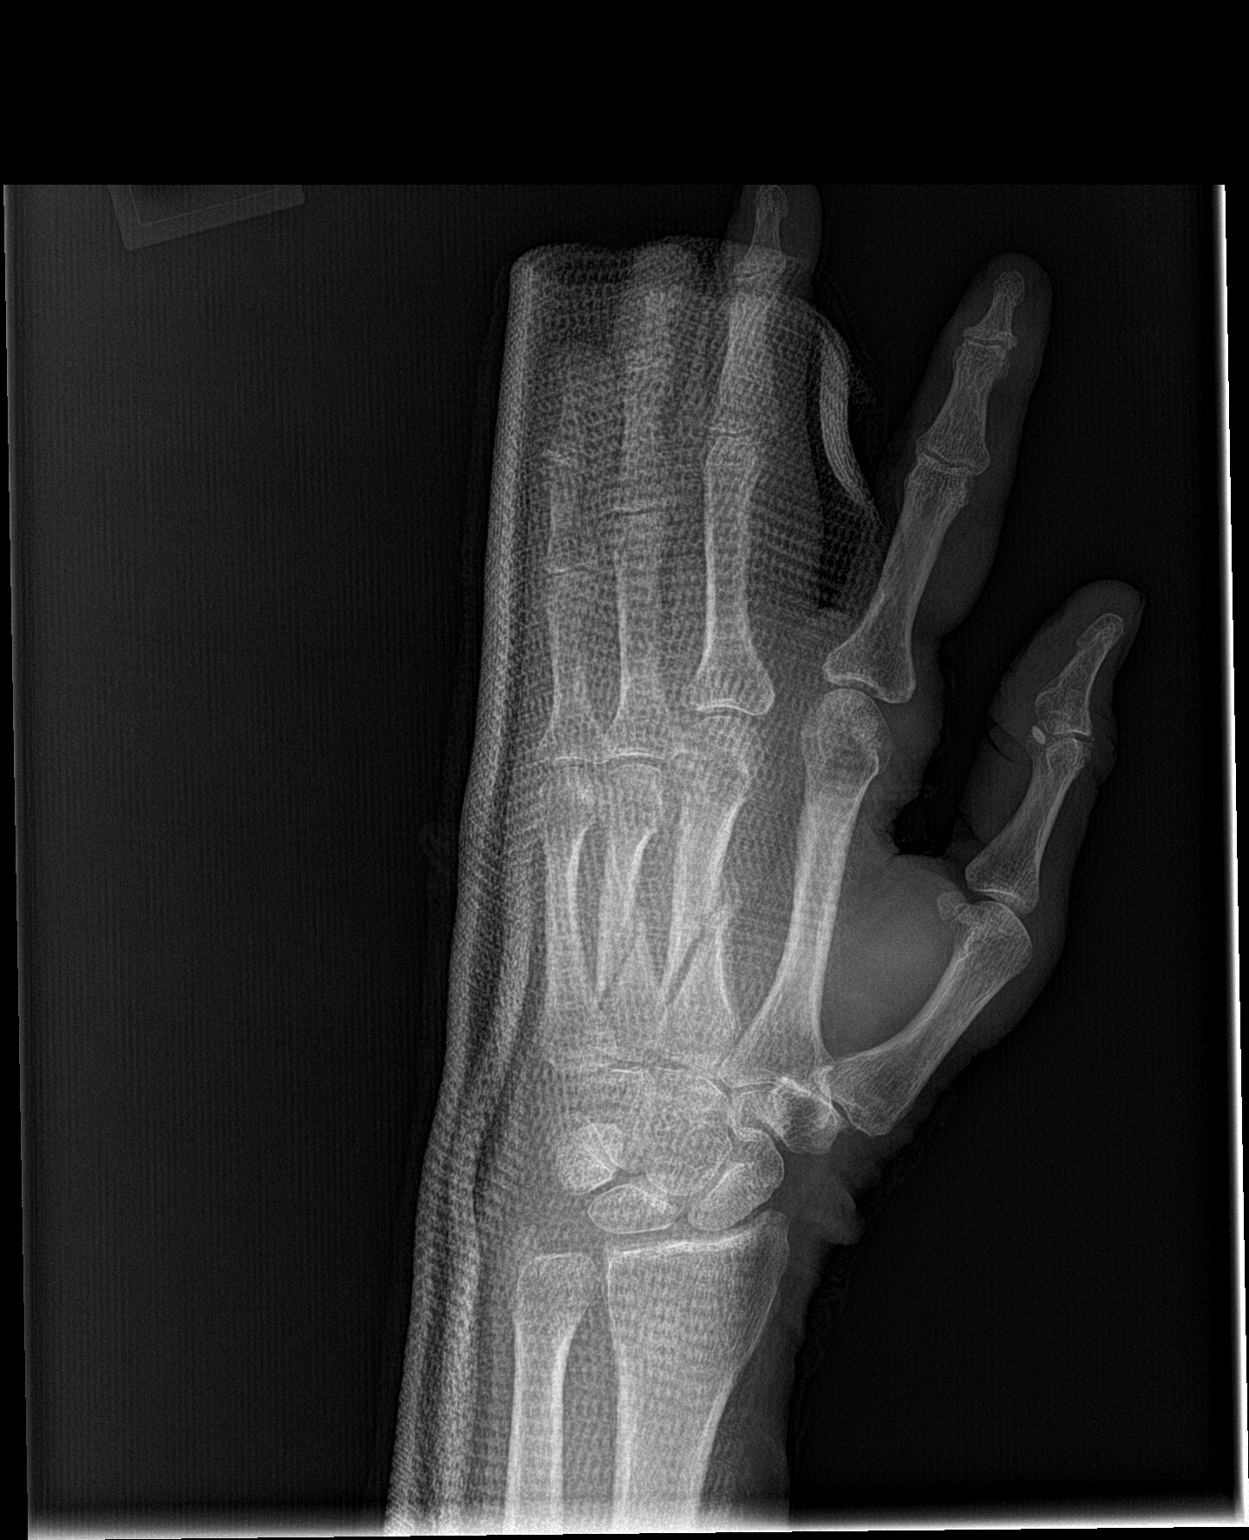

[hand lat]
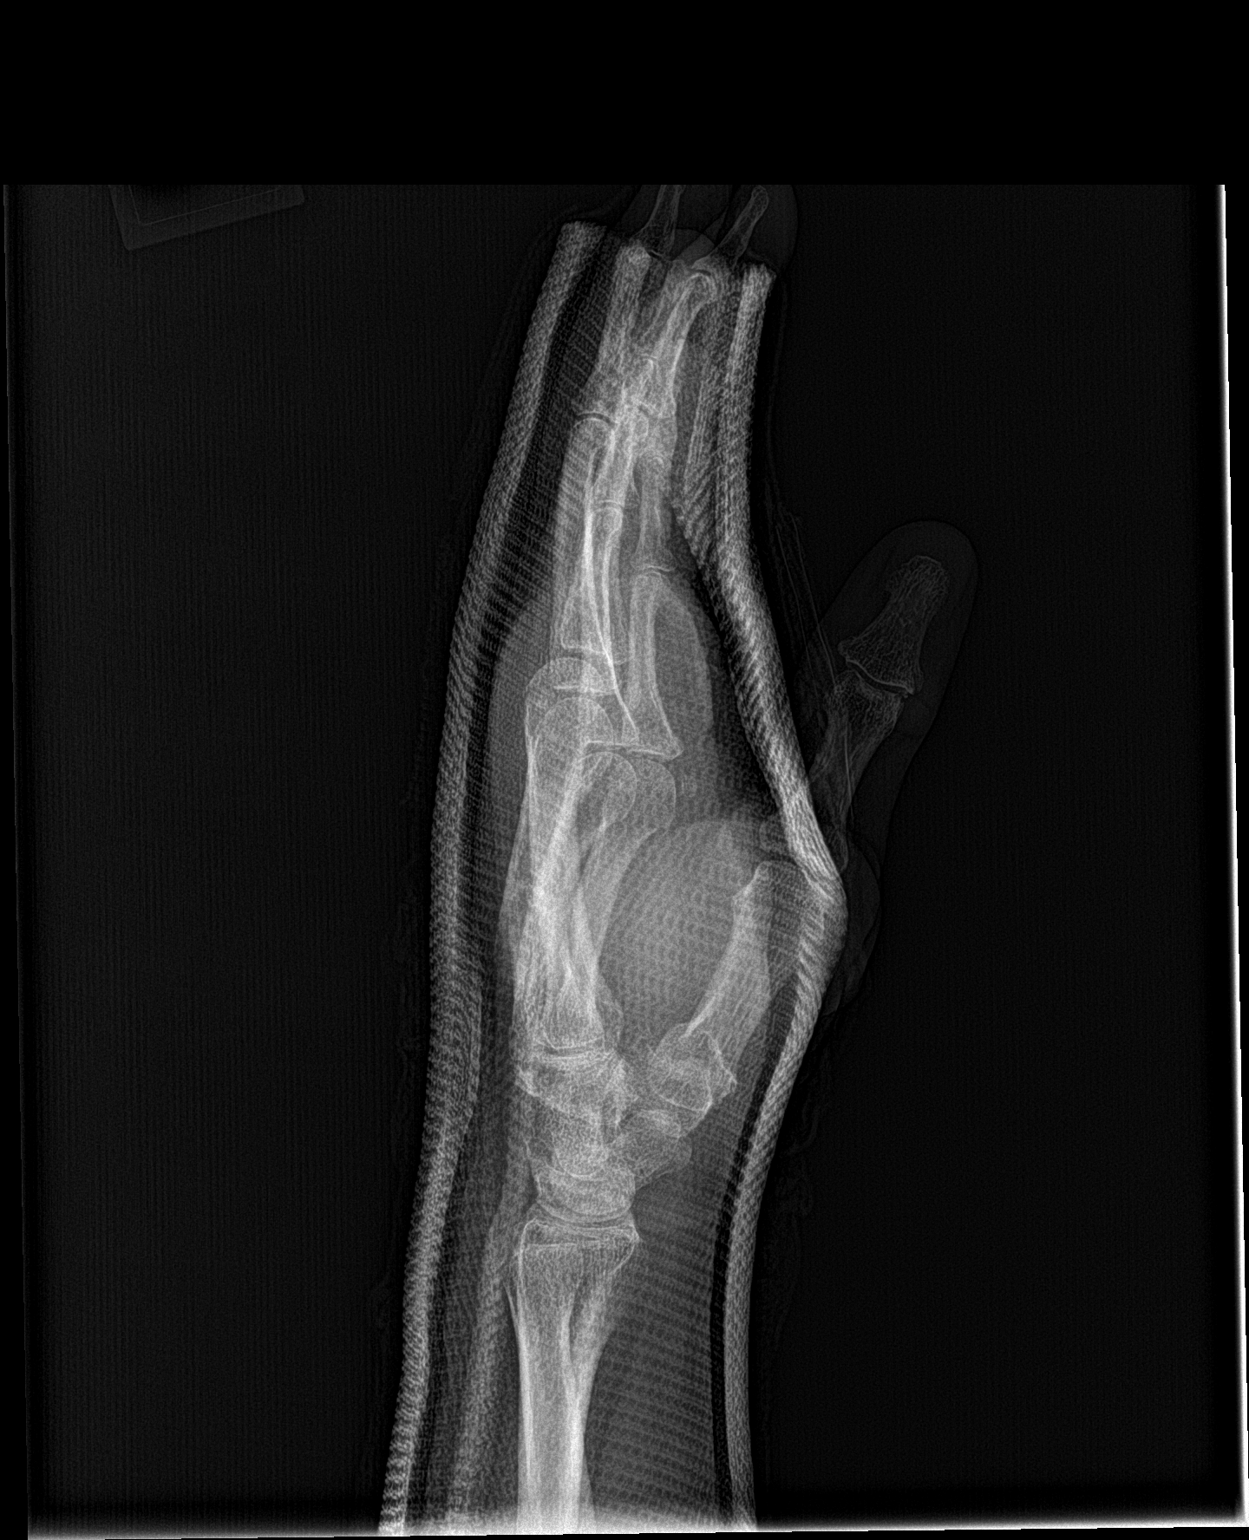

[3 of 3 positions shown; findings below may reference images not displayed]

FINDINGS: Three views of the right hand submitted. Study is limited by
splinting material artifact.

Again noted minimal displaced oblique fracture of third and fourth
metacarpals with healing response/callus formation. The fracture
line is still visible.
IMPRESSION: Again noted minimal displaced oblique fracture third and fourth
metacarpals with healing response/ callus formation noted.

## 2017-02-06 ENCOUNTER — Other Ambulatory Visit: Payer: Self-pay | Admitting: Obstetrics and Gynecology

## 2017-02-06 DIAGNOSIS — Z1231 Encounter for screening mammogram for malignant neoplasm of breast: Secondary | ICD-10-CM

## 2017-03-10 ENCOUNTER — Ambulatory Visit (INDEPENDENT_AMBULATORY_CARE_PROVIDER_SITE_OTHER): Payer: BLUE CROSS/BLUE SHIELD

## 2017-03-10 ENCOUNTER — Other Ambulatory Visit: Payer: Self-pay | Admitting: Obstetrics and Gynecology

## 2017-03-10 DIAGNOSIS — Z1231 Encounter for screening mammogram for malignant neoplasm of breast: Secondary | ICD-10-CM

## 2018-02-19 ENCOUNTER — Other Ambulatory Visit: Payer: Self-pay | Admitting: Obstetrics and Gynecology

## 2018-02-19 DIAGNOSIS — Z0000 Encounter for general adult medical examination without abnormal findings: Secondary | ICD-10-CM

## 2018-03-14 ENCOUNTER — Ambulatory Visit (INDEPENDENT_AMBULATORY_CARE_PROVIDER_SITE_OTHER): Payer: BLUE CROSS/BLUE SHIELD

## 2018-03-14 DIAGNOSIS — Z1231 Encounter for screening mammogram for malignant neoplasm of breast: Secondary | ICD-10-CM

## 2018-03-14 DIAGNOSIS — Z0000 Encounter for general adult medical examination without abnormal findings: Secondary | ICD-10-CM

## 2020-03-01 ENCOUNTER — Emergency Department
Admission: RE | Admit: 2020-03-01 | Discharge: 2020-03-01 | Disposition: A | Discharge status: Home / Self Care | Payer: BC Managed Care – PPO | Source: Ambulatory Visit

## 2020-03-01 ENCOUNTER — Other Ambulatory Visit: Payer: Self-pay

## 2020-03-01 ENCOUNTER — Emergency Department (INDEPENDENT_AMBULATORY_CARE_PROVIDER_SITE_OTHER): Payer: BC Managed Care – PPO

## 2020-03-01 VITALS — BP 134/88 | HR 85 | Temp 99.1°F

## 2020-03-01 DIAGNOSIS — R059 Cough, unspecified: Secondary | ICD-10-CM | POA: Diagnosis not present

## 2020-03-01 DIAGNOSIS — R0602 Shortness of breath: Secondary | ICD-10-CM | POA: Diagnosis not present

## 2020-03-01 DIAGNOSIS — R053 Chronic cough: Secondary | ICD-10-CM | POA: Diagnosis not present

## 2020-03-01 MED ORDER — IPRATROPIUM BROMIDE 0.06 % NA SOLN: 2.0000 | Freq: Four times a day (QID) | NASAL | 1 refills | Status: AC

## 2020-03-01 MED ORDER — BENZONATATE 100 MG PO CAPS: 100.0000 mg | ORAL_CAPSULE | Freq: Three times a day (TID) | ORAL | 0 refills | Status: AC

## 2020-03-01 NOTE — ED Provider Notes (Signed)
[facility]    CSN: [id number] Arrival date & time: 03/01/20  6269      History   Chief Complaint Chief Complaint  Patient presents with  . Appointment    cough    HPI Laurie Weaver is a 60 y.o. female.   HPI  Laurie Weaver is a 60 y.o. female presenting to UC with c/o a minimally productive cough for about 3 weeks, preceded by a mildly scratchy throat and hoarse voice. Pt self dx laryngitis, improved after 5 days of home care but cough has continued.  She initially tried mucinex but did not have relief. She has been using cough drops without relief. Denies fever, chills, n/v/d. Mild post-nasal drainage. She has been taking Singulair and using Flonase.  No known sick contacts but she travels often for work. Not vaccinated for COVID. Has not been tested for COVID since onset of symptoms. Has not f/u with PCP for ongoing cough due to work schedule.    History reviewed. No pertinent past medical history.  Patient Active Problem List   Diagnosis Date Noted  . Closed fracture of third and fourth metacarpals of left hand 12/16/2014    Past Surgical History:  Procedure Laterality Date  . CHOLECYSTECTOMY      OB History   No obstetric history on file.      Home Medications    Prior to Admission medications   Medication Sig Start Date End Date Taking? Authorizing Provider  benzonatate (TESSALON) 100 MG capsule Take 1 capsule (100 mg total) by mouth every 8 (eight) hours. 03/01/20  Yes Phelps, Erin O, PA-C  ipratropium (ATROVENT) 0.06 % nasal spray Place 2 sprays into both nostrils 4 (four) times daily. 03/01/20  Yes Phelps, Erin O, PA-C  montelukast (SINGULAIR) 10 MG tablet Take 10 mg by mouth at bedtime.   Yes [provider]  acetaminophen (TYLENOL) 325 MG tablet Take 650 mg by mouth every 6 (six) hours as needed.    [provider]    Family History Family History  Problem Relation Age of Onset  . Diabetes Mother     Social History Social  History   Tobacco Use  . Smoking status: Never Smoker  . Smokeless tobacco: Never Used  Substance Use Topics  . Alcohol use: Yes  . Drug use: No     Allergies   Patient has no known allergies.   Review of Systems Review of Systems  Constitutional: Negative for chills and fever.  HENT: Positive for congestion (minimal) and postnasal drip (mild). Negative for ear pain, sore throat, trouble swallowing and voice change.   Respiratory: Positive for cough. Negative for shortness of breath.   Cardiovascular: Negative for chest pain and palpitations.  Gastrointestinal: Negative for abdominal pain, diarrhea, nausea and vomiting.  Musculoskeletal: Negative for arthralgias, back pain and myalgias.  Skin: Negative for rash.  Neurological: Negative for dizziness, light-headedness and headaches.  All other systems reviewed and are negative.    Physical Exam Triage Vital Signs ED Triage Vitals [03/01/20 0956]  Enc Vitals Group     BP 134/88     Pulse Rate 85     Resp      Temp 99.1 F (37.3 C)     Temp Source Oral     SpO2 97 %     Weight      Height      Head Circumference      Peak Flow      Pain Score 0  Pain Loc      Pain Edu?      Excl. in GC?    No data found.  Updated Vital Signs BP 134/88 (BP Location: Right Arm)   Pulse 85   Temp 99.1 F (37.3 C) (Oral)   SpO2 97%   Visual Acuity Right Eye Distance:   Left Eye Distance:   Bilateral Distance:    Right Eye Near:   Left Eye Near:    Bilateral Near:     Physical Exam Vitals and nursing note reviewed.  Constitutional:      General: She is not in acute distress.    Appearance: Normal appearance. She is well-developed and well-nourished. She is not ill-appearing, toxic-appearing or diaphoretic.  HENT:     Head: Normocephalic and atraumatic.     Right Ear: Tympanic membrane and ear canal normal.     Left Ear: Tympanic membrane and ear canal normal.     Nose: Nose normal.     Right Sinus: No maxillary  sinus tenderness or frontal sinus tenderness.     Left Sinus: No maxillary sinus tenderness or frontal sinus tenderness.     Mouth/Throat:     Lips: Pink.     Mouth: Mucous membranes are moist.     Pharynx: Oropharynx is clear. Uvula midline.  Eyes:     Extraocular Movements: EOM normal.  Cardiovascular:     Rate and Rhythm: Normal rate and regular rhythm.  Pulmonary:     Effort: Pulmonary effort is normal. No respiratory distress.     Breath sounds: Normal breath sounds. No stridor. No wheezing, rhonchi or rales.  Musculoskeletal:        General: Normal range of motion.     Cervical back: Normal range of motion and neck supple. No tenderness.  Lymphadenopathy:     Cervical: No cervical adenopathy.  Skin:    General: Skin is warm and dry.  Neurological:     Mental Status: She is alert and oriented to person, place, and time.  Psychiatric:        Mood and Affect: Mood and affect normal.        Behavior: Behavior normal.      UC Treatments / Results  Labs (all labs ordered are listed, but only abnormal results are displayed) Labs Reviewed  SARS-COV-2 RNA,(COVID-19) QUALITATIVE NAAT    EKG   Radiology DG Chest 2 View  Result Date: 03/01/2020 CLINICAL DATA:  Cough, shortness of breath. EXAM: CHEST - 2 VIEW COMPARISON:  None. FINDINGS: The heart size and mediastinal contours are within normal limits. Both lungs are clear. No pneumothorax or pleural effusion is noted. The visualized skeletal structures are unremarkable. IMPRESSION: No active cardiopulmonary disease. Electronically Signed   By: Lupita Raider M.D.   On: 03/01/2020 10:29    Procedures Procedures (including critical care time)  Medications Ordered in UC Medications - No data to display  Initial Impression / Assessment and Plan / UC Course  I have reviewed the triage vital signs and the nursing notes.  Pertinent labs & imaging results that were available during my care of the patient were reviewed by me  and considered in my medical decision making (see chart for details).    Reviewed imaging with pt No evidence of bacterial infection at this time. Recommended COVID testing due to ongoing cough, pt not vaccinated, travels for work. Pt requested a "spit test," declined COVID PCR test here. Rx: trial of tessalon and ipratropium nasal spray F/u with  PCP this week if not improving  Final Clinical Impressions(s) / UC Diagnoses   Final diagnoses:  Persistent cough for 3 weeks or longer     Discharge Instructions      Call to schedule a follow up appointment with primary care next week if cough not improving with the medications prescribed today.     ED Prescriptions    Medication Sig Dispense Auth. Provider   ipratropium (ATROVENT) 0.06 % nasal spray Place 2 sprays into both nostrils 4 (four) times daily. 15 mL Phelps, Erin O, PA-C   benzonatate (TESSALON) 100 MG capsule Take 1 capsule (100 mg total) by mouth every 8 (eight) hours. 21 capsule Lurene Shadow, [place]     PDMP not reviewed this encounter.   Lurene Shadow, PA-C 03/01/20 1230

## 2020-03-01 NOTE — ED Triage Notes (Signed)
Patient states that she had laryngitis about 3 weeks ago, developed a cough can't get rid of.  Patient has tried Mucinex for the cough, cough comes and goes.  Patient is not vaccinated.

## 2020-03-01 NOTE — ED Notes (Signed)
Patient refused to have COVID swab performed.  Provider notified.

## 2020-03-01 NOTE — Discharge Instructions (Signed)
  Call to schedule a follow up appointment with primary care next week if cough not improving with the medications prescribed today.

## 2020-03-02 LAB — SARS-COV-2 RNA,(COVID-19) QUALITATIVE NAAT

## 2021-10-05 IMAGING — DX DG CHEST 2V
2 series · 2 of 2 positions shown · non-contrast
Comparison: None.

CLINICAL DATA: Cough, shortness of breath.

EXAM:
CHEST - 2 VIEW

[chest pa]
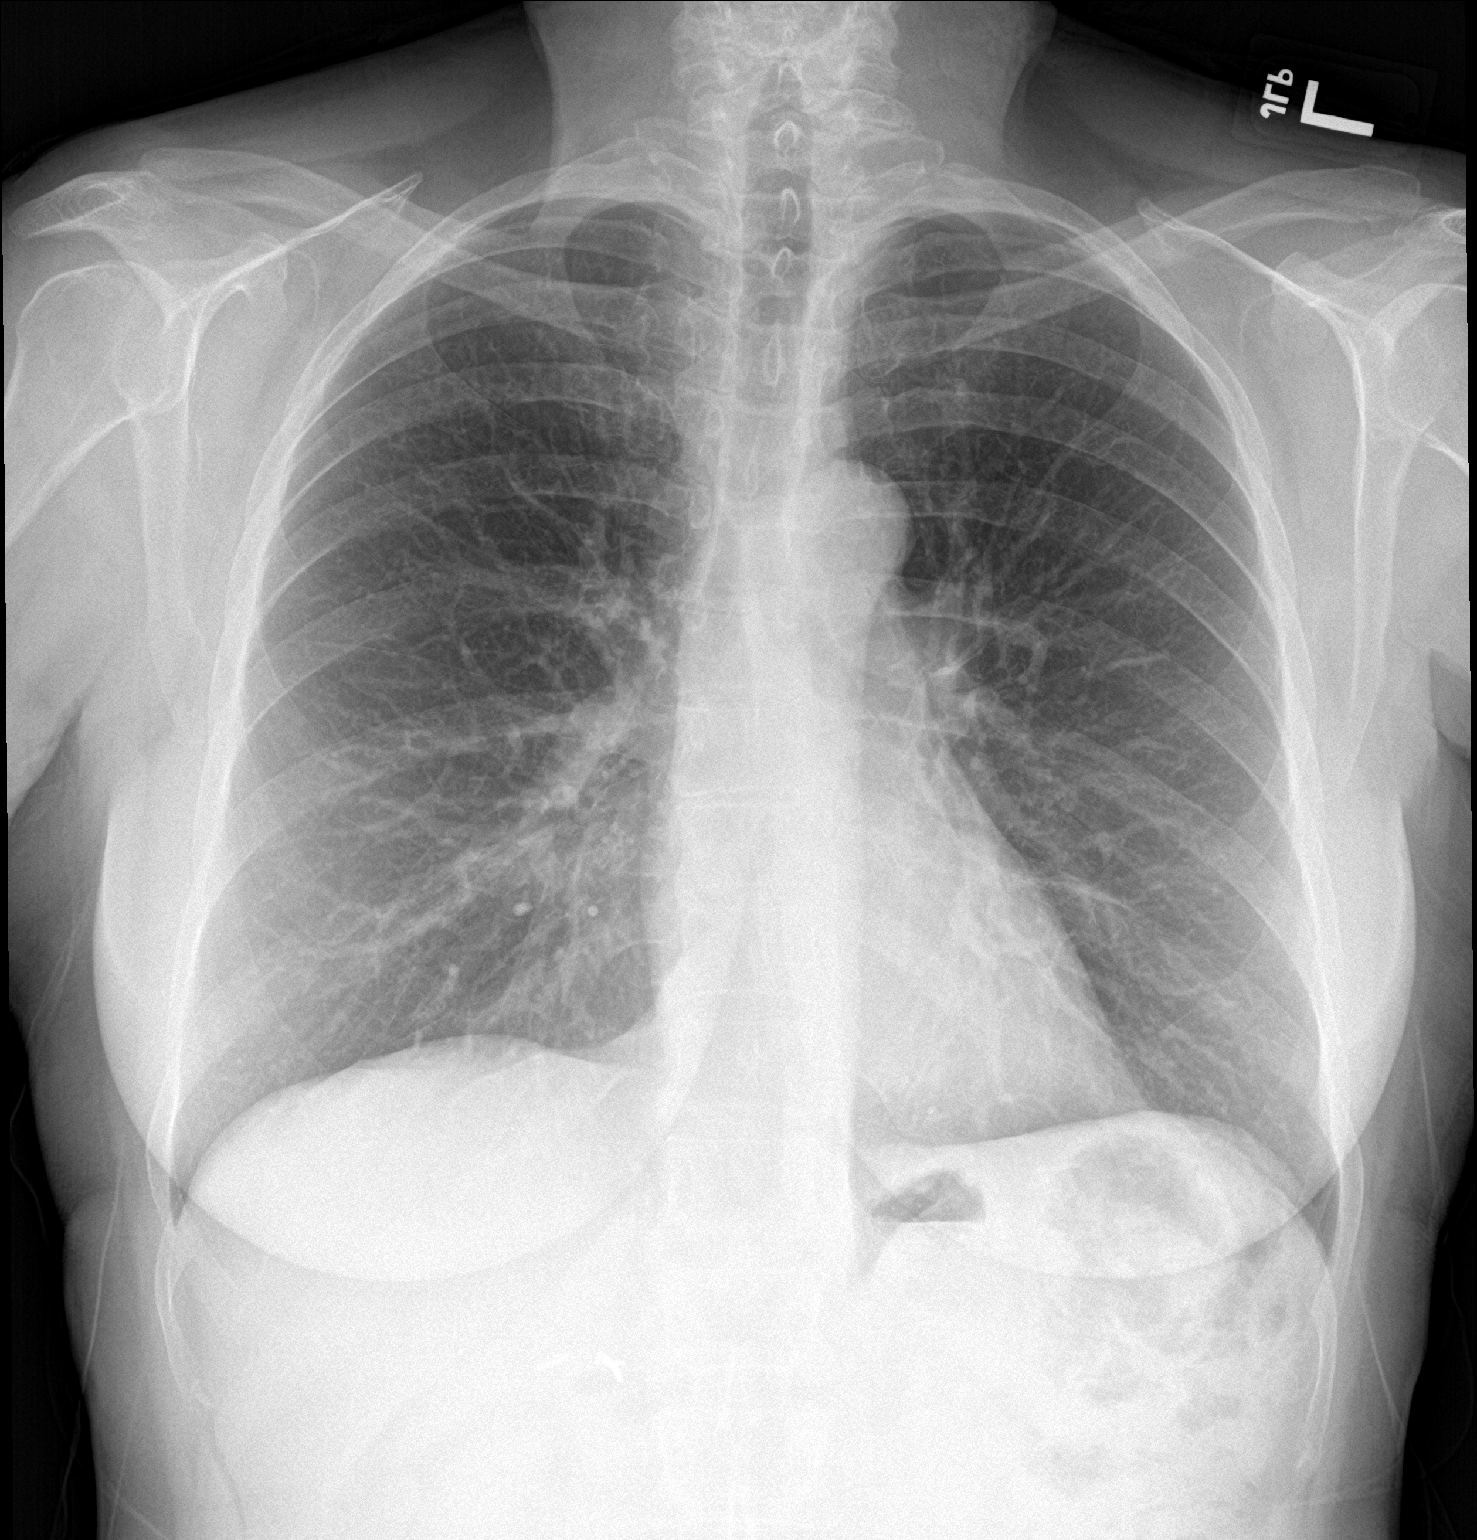

[chest lat]
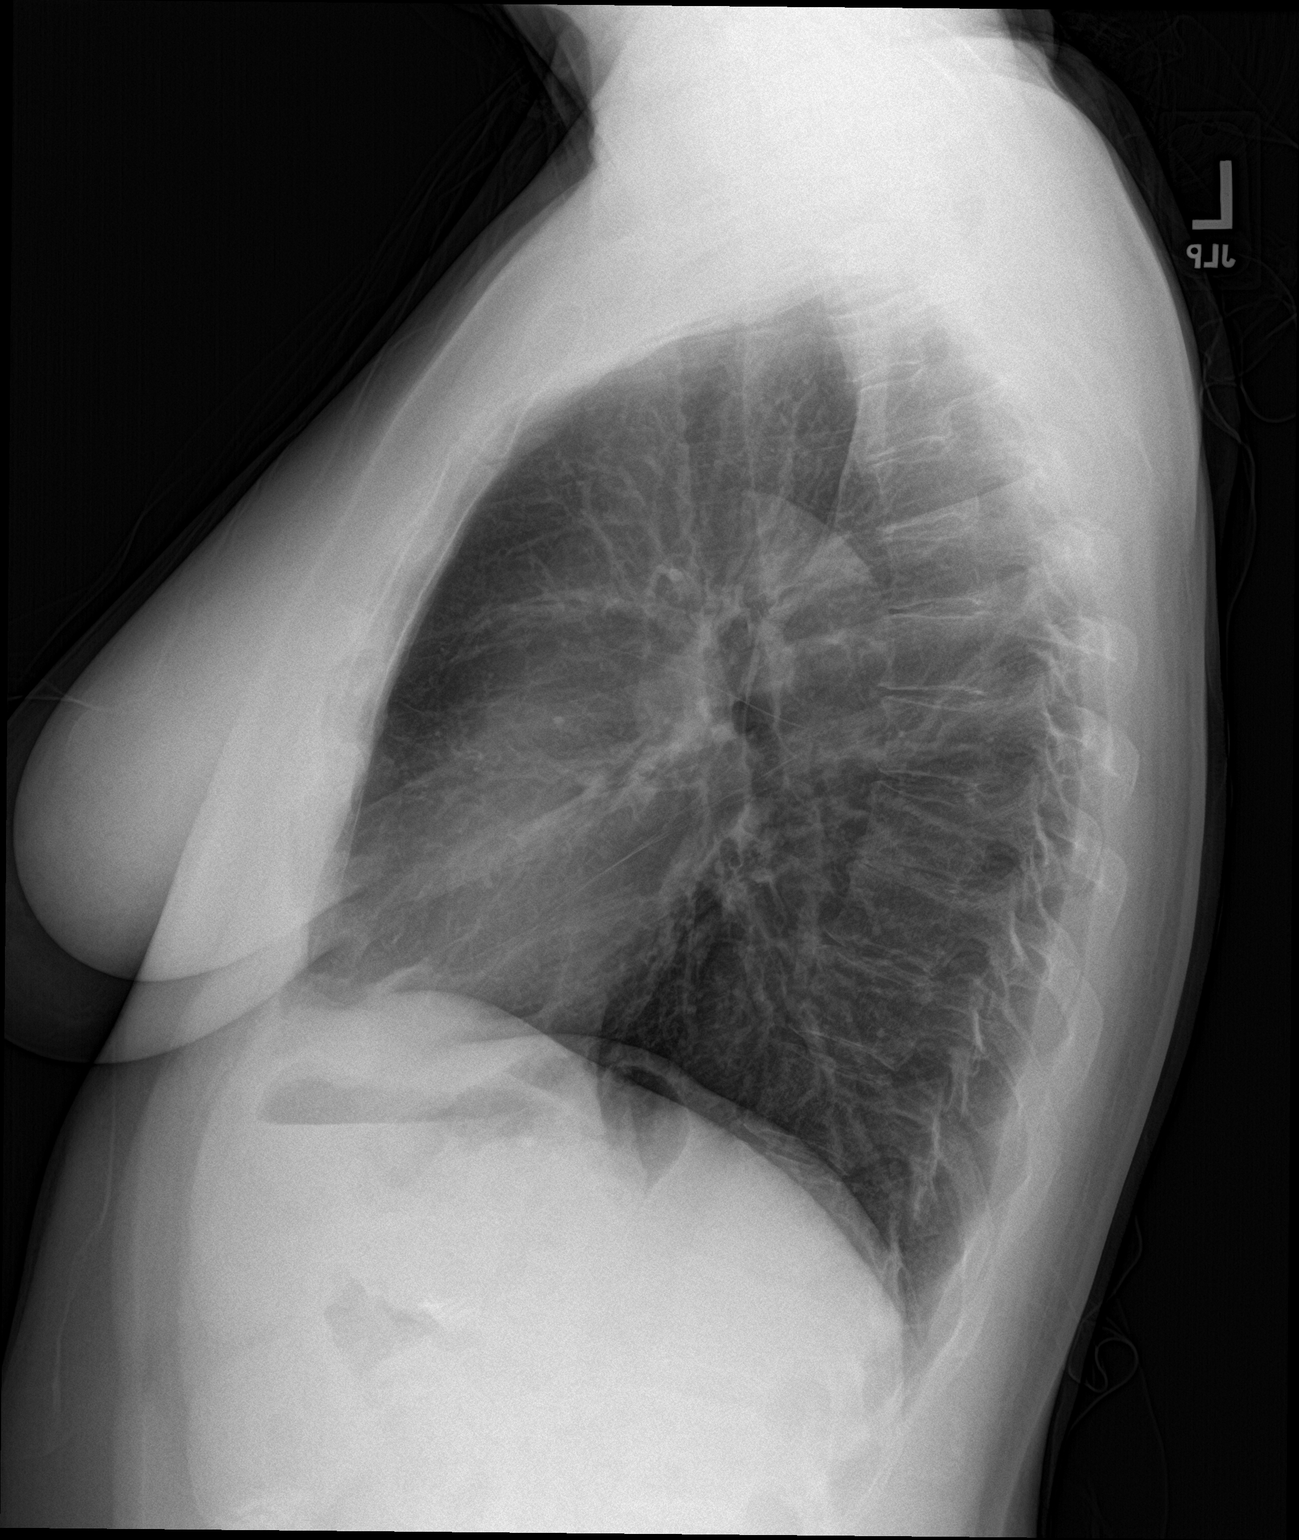

[2 of 2 positions shown; findings below may reference images not displayed]

FINDINGS: The heart size and mediastinal contours are within normal limits.
Both lungs are clear. No pneumothorax or pleural effusion is noted.
The visualized skeletal structures are unremarkable.
IMPRESSION: No active cardiopulmonary disease.

## 2022-05-08 ENCOUNTER — Ambulatory Visit: Payer: Self-pay

## 2022-05-08 ENCOUNTER — Telehealth: Payer: Self-pay | Admitting: Emergency Medicine

## 2022-05-08 NOTE — Telephone Encounter (Signed)
Call to patient regarding reason for visit ( fever and abdominal pain) history of diverticulitis. Chart review by provider here today who is redirecting Addlee to the ED for a higher level of care and evaluation.  Pt states fever has resolved and abdominal pain is better. Dr Delton See requesting pt be evaluated in ED or may follow up with her PCP. No other questions at this time

## 2022-05-09 ENCOUNTER — Ambulatory Visit
Admission: RE | Admit: 2022-05-09 | Discharge: 2022-05-09 | Disposition: A | Discharge status: Home / Self Care | Payer: BC Managed Care – PPO | Source: Ambulatory Visit | Attending: Family Medicine | Admitting: Family Medicine

## 2022-05-09 VITALS — BP 128/81 | HR 81 | Temp 99.4°F | Resp 16 | Ht 61.0 in | Wt 122.0 lb

## 2022-05-09 DIAGNOSIS — R1032 Left lower quadrant pain: Secondary | ICD-10-CM

## 2022-05-09 MED ORDER — FLUCONAZOLE 150 MG PO TABS: ORAL_TABLET | ORAL | 0 refills | Status: AC

## 2022-05-09 MED ORDER — METRONIDAZOLE 500 MG PO TABS: 500.0000 mg | ORAL_TABLET | Freq: Three times a day (TID) | ORAL | 0 refills | Status: AC

## 2022-05-09 MED ORDER — CIPROFLOXACIN HCL 500 MG PO TABS: 500.0000 mg | ORAL_TABLET | Freq: Two times a day (BID) | ORAL | 0 refills | Status: AC

## 2022-05-09 NOTE — ED Provider Notes (Signed)
[facility]    CSN: [id number] Arrival date & time: 05/09/22  1144      History   Chief Complaint Chief Complaint  Patient presents with   Abdominal Pain    Have had diverticulitis in the past.  Fever last night,no fever now.  Pain is better today. - Entered by patient    HPI Laurie Weaver is a 62 y.o. female.   HPI Pleasant 62 year old female presents with lower left quadrant abdominal pain and believes this is a diverticulitis flare.  Reports normally she is able to manage this with her diet; however, left lower quadrant abdominal pain, but and bloating has been worsening over the past several days.  Chart reviewed reveals diverticulitis on 04/11/2017.  History reviewed. No pertinent past medical history.  Patient Active Problem List   Diagnosis Date Noted   Closed fracture of third and fourth metacarpals of left hand 12/16/2014    Past Surgical History:  Procedure Laterality Date   CHOLECYSTECTOMY      OB History   No obstetric history on file.      Home Medications    Prior to Admission medications   Medication Sig Start Date End Date Taking? Authorizing Provider  ciprofloxacin (CIPRO) 500 MG tablet Take 1 tablet (500 mg total) by mouth 2 (two) times daily for 10 days. 05/09/22 05/19/22 Yes Trevor Iha, FNP  fluconazole (DIFLUCAN) 150 MG tablet Take 1 tab p.o. for vaginal candidiasis, may repeat 1 tab p.o. in 3 days if symptoms are not resolved. 05/09/22  Yes Trevor Iha, FNP  metroNIDAZOLE (FLAGYL) 500 MG tablet Take 1 tablet (500 mg total) by mouth 3 (three) times daily for 10 days. 05/09/22 05/19/22 Yes Trevor Iha, FNP  montelukast (SINGULAIR) 10 MG tablet Take 10 mg by mouth at bedtime.   Yes [provider]  acetaminophen (TYLENOL) 325 MG tablet Take 650 mg by mouth every 6 (six) hours as needed.    [provider]  benzonatate (TESSALON) 100 MG capsule Take 1 capsule (100 mg total) by mouth every 8 (eight) hours. 03/01/20    Lurene Shadow, PA-C  ipratropium (ATROVENT) 0.06 % nasal spray Place 2 sprays into both nostrils 4 (four) times daily. 03/01/20   Lurene Shadow, PA-C    Family History Family History  Problem Relation Age of Onset   Diabetes Mother     Social History Social History   Tobacco Use   Smoking status: Never   Smokeless tobacco: Never  Vaping Use   Vaping Use: Never used  Substance Use Topics   Alcohol use: Yes   Drug use: No     Allergies   Patient has no known allergies.   Review of Systems Review of Systems  Gastrointestinal:  Positive for abdominal pain.  All other systems reviewed and are negative.    Physical Exam Triage Vital Signs ED Triage Vitals  Enc Vitals Group     BP 05/09/22 1205 128/81     Pulse Rate 05/09/22 1205 81     Resp 05/09/22 1205 16     Temp 05/09/22 1205 99.4 F (37.4 C)     Temp Source 05/09/22 1205 Oral     SpO2 05/09/22 1205 96 %     Weight 05/09/22 1207 122 lb (55.3 kg)     Height 05/09/22 1207  (1.549 m)     Head Circumference --      Peak Flow --      Pain Score 05/09/22 1207 6  Pain Loc --      Pain Edu? --      Excl. in GC? --    No data found.  Updated Vital Signs BP 128/81 (BP Location: Right Arm)   Pulse 81   Temp 99.4 F (37.4 C) (Oral)   Resp 16   Ht 5\' 1"  (1.549 m)   Wt 122 lb (55.3 kg)   SpO2 96%   BMI 23.05 kg/m      Physical Exam Vitals reviewed.  Constitutional:      Appearance: She is well-developed and normal weight.  HENT:     Head: Normocephalic and atraumatic.     Mouth/Throat:     Mouth: Mucous membranes are moist.  Eyes:     Extraocular Movements: Extraocular movements intact.     Pupils: Pupils are equal, round, and reactive to light.  Cardiovascular:     Rate and Rhythm: Normal rate and regular rhythm.     Heart sounds: Normal heart sounds. No murmur heard. Pulmonary:     Effort: Pulmonary effort is normal.     Breath sounds: Normal breath sounds. No wheezing, rhonchi or  rales.  Abdominal:     General: Abdomen is flat. Bowel sounds are normal. There is no distension or abdominal bruit.     Palpations: Abdomen is soft. There is no shifting dullness, fluid wave, hepatomegaly, splenomegaly, mass or pulsatile mass.     Tenderness: There is abdominal tenderness in the left lower quadrant. There is no right CVA tenderness, left CVA tenderness, guarding or rebound. Negative signs include Murphy's sign, McBurney's sign and psoas sign.     Hernia: No hernia is present.  Skin:    General: Skin is warm and dry.  Neurological:     General: No focal deficit present.     Mental Status: She is alert and oriented to person, place, and time.  Psychiatric:        Mood and Affect: Mood normal.        Behavior: Behavior normal.     UC Treatments / Results  Labs (all labs ordered are listed, but only abnormal results are displayed) Labs Reviewed - No data to display  EKG   Radiology No results found.  Procedures Procedures (including critical care time)  Medications Ordered in UC Medications - No data to display  Initial Impression / Assessment and Plan / UC Course  I have reviewed the triage vital signs and the nursing notes.  Pertinent labs & imaging results that were available during my care of the patient were reviewed by me and considered in my medical decision making (see chart for details).     MDM: 1. Abdominal pain, left lower quadrant-Rx'd Cipro 500 mg twice daily x 10 days, Flagyl 500 mg 3 times daily x 10 days. Instructed patient to take medications as directed with food to completion.  Encouraged increased daily water intake to 64 ounces per day while taking these medications.  Advised may use Diflucan as needed if necessary.  Advised if symptoms worsen and/or unresolved please follow-up with GI or here for further evaluation. Final Clinical Impressions(s) / UC Diagnoses   Final diagnoses:  Abdominal pain, left lower quadrant     Discharge  Instructions      Instructed patient to take medications as directed with food to completion.  Encouraged increased daily water intake to 64 ounces per day while taking these medications.  Advised may use Diflucan as needed if necessary.  Advised if symptoms worsen and/or  unresolved please follow-up with GI or here for further evaluation.     ED Prescriptions     Medication Sig Dispense Auth. Provider   ciprofloxacin (CIPRO) 500 MG tablet Take 1 tablet (500 mg total) by mouth 2 (two) times daily for 10 days. 20 tablet Trevor Iha, FNP   metroNIDAZOLE (FLAGYL) 500 MG tablet Take 1 tablet (500 mg total) by mouth 3 (three) times daily for 10 days. 30 tablet Trevor Iha, FNP   fluconazole (DIFLUCAN) 150 MG tablet Take 1 tab p.o. for vaginal candidiasis, may repeat 1 tab p.o. in 3 days if symptoms are not resolved. 7 tablet Trevor Iha, FNP      PDMP not reviewed this encounter.   Trevor Iha, FNP 05/09/22 1540

## 2022-05-09 NOTE — ED Triage Notes (Signed)
Patient states that she is having a diverticulitis flare up the last couple of days.  Normally able to manage with her diet.  LLQ pain, bloating, denies any nausea or vomiting.  Patient has taken Tylenol for pain.

## 2022-05-09 NOTE — Discharge Instructions (Addendum)
Instructed patient to take medications as directed with food to completion.  Encouraged increased daily water intake to 64 ounces per day while taking these medications.  Advised may use Diflucan as needed if necessary.  Advised if symptoms worsen and/or unresolved please follow-up with GI or here for further evaluation.

## 2022-07-25 ENCOUNTER — Ambulatory Visit: Payer: Self-pay
